# Patient Record
Sex: Male | Born: 1995 | Race: Black or African American | Hispanic: No | Marital: Single | Smoking: Never smoker
Health system: Southern US, Community
[De-identification: ages and names within clinical notes are randomized; demographics above are authoritative.]

## PROBLEM LIST (undated history)

## (undated) DIAGNOSIS — F909 Attention-deficit hyperactivity disorder, unspecified type: Secondary | ICD-10-CM

## (undated) HISTORY — DX: Attention-deficit hyperactivity disorder, unspecified type: F90.9

---
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
## 2018-02-14 ENCOUNTER — Emergency Department (HOSPITAL_COMMUNITY): Payer: Medicaid Other

## 2018-02-14 ENCOUNTER — Other Ambulatory Visit: Payer: Self-pay

## 2018-02-14 ENCOUNTER — Encounter (HOSPITAL_COMMUNITY): Payer: Self-pay | Admitting: *Deleted

## 2018-02-14 ENCOUNTER — Emergency Department (HOSPITAL_COMMUNITY)
Admission: EM | Admit: 2018-02-14 | Discharge: 2018-02-15 | Disposition: A | Discharge status: Home / Self Care | Payer: Medicaid Other | Attending: Emergency Medicine | Admitting: Emergency Medicine

## 2018-02-14 DIAGNOSIS — Z79899 Other long term (current) drug therapy: Secondary | ICD-10-CM | POA: Insufficient documentation

## 2018-02-14 DIAGNOSIS — R1011 Right upper quadrant pain: Secondary | ICD-10-CM

## 2018-02-14 LAB — CBC
HCT: 43.5 % (ref 39.0–52.0)
Hemoglobin: 14.2 g/dL (ref 13.0–17.0)
MCH: 28.1 pg (ref 26.0–34.0)
MCHC: 32.6 g/dL (ref 30.0–36.0)
MCV: 86 fL (ref 78.0–100.0)
PLATELETS: 202 10*3/uL (ref 150–400)
RBC: 5.06 MIL/uL (ref 4.22–5.81)
RDW: 14 % (ref 11.5–15.5)
WBC: 7.1 10*3/uL (ref 4.0–10.5)

## 2018-02-14 LAB — COMPREHENSIVE METABOLIC PANEL
ALBUMIN: 4.3 g/dL (ref 3.5–5.0)
ALT: 26 U/L (ref 0–44)
ANION GAP: 10 (ref 5–15)
AST: 27 U/L (ref 15–41)
Alkaline Phosphatase: 45 U/L (ref 38–126)
BILIRUBIN TOTAL: 1 mg/dL (ref 0.3–1.2)
BUN: 9 mg/dL (ref 6–20)
CALCIUM: 9.7 mg/dL (ref 8.9–10.3)
CO2: 28 mmol/L (ref 22–32)
CREATININE: 1.06 mg/dL (ref 0.61–1.24)
Chloride: 106 mmol/L (ref 98–111)
GFR calc Af Amer: >60 mL/min (ref >60)
Glucose, Bld: 92 mg/dL (ref 70–99)
Potassium: 3.6 mmol/L (ref 3.5–5.1)
Sodium: 144 mmol/L (ref 135–145)
TOTAL PROTEIN: 7.8 g/dL (ref 6.5–8.1)

## 2018-02-14 LAB — LIPASE, BLOOD: LIPASE: 23 U/L (ref 11–51)

## 2018-02-14 NOTE — ED Provider Notes (Signed)
[facility]-EMERGENCY DEPT Provider Note   CSN: [id number] Arrival date & time: 02/14/18  4098     History   Chief Complaint Chief Complaint  Patient presents with  . Abdominal Pain    HPI Numair Masden is a 22 y.o. male.  The history is provided by the patient and medical records.  Abdominal Pain   Associated symptoms include nausea.     22 year old male with no significant past medical history presenting to the ED for right upper abdominal pain.  States started this morning he was trying to eat breakfast.  He cannot recall what he was trying to eat this morning.  States he did eat some crackers and water prior to arrival which also hurt his stomach.  He has noticed pain eases if not trying to eat.  He denies any fevers or chills.  Had a little bit of nausea this morning but that seems to have resolved.  Does report diet heavy in greasy fast food.  He has been trying to change his diet over the past 2 weeks.  No prior abdominal surgeries.  No medications tried prior to arrival.  Patient was eating potato chips in the waiting room at time of triage.  History reviewed. No pertinent past medical history.  There are no active problems to display for this patient.   History reviewed. No pertinent surgical history.      Home Medications    Prior to Admission medications   Medication Sig Start Date End Date Taking? Authorizing Provider  acetaminophen (TYLENOL) 500 MG tablet Take 500 mg by mouth every 6 (six) hours as needed for moderate pain.   Yes [provider]  ADDERALL XR 10 MG 24 hr capsule Take 10 mg by mouth daily.  12/07/17  Yes [provider]    Family History No family history on file.  Social History Social History   Tobacco Use  . Smoking status: Never Smoker  . Smokeless tobacco: Never Used  Substance Use Topics  . Alcohol use: Yes    Comment: states 'i'm never gonna drink again' and that he rarely drinks  . Drug use:  No     Allergies   Patient has no known allergies.   Review of Systems Review of Systems  Gastrointestinal: Positive for abdominal pain and nausea.  All other systems reviewed and are negative.    Physical Exam Updated Vital Signs BP (!) 146/65 (BP Location: Left Arm)   Pulse 65   Temp 98.6 F (37 C) (Oral)   Resp 17   Ht 6' (1.829 m)   Wt 80.3 kg   SpO2 97%   BMI 24.01 kg/m   Physical Exam  Constitutional: He is oriented to person, place, and time. He appears well-developed and well-nourished.  HENT:  Head: Normocephalic and atraumatic.  Mouth/Throat: Oropharynx is clear and moist.  Eyes: Pupils are equal, round, and reactive to light. Conjunctivae and EOM are normal.  Neck: Normal range of motion.  Cardiovascular: Normal rate, regular rhythm and normal heart sounds.  Pulmonary/Chest: Effort normal and breath sounds normal.  Abdominal: Soft. Bowel sounds are normal. There is no tenderness. There is no rigidity and no guarding.  Abdomen soft, non-tender; negative murphy's sign  Musculoskeletal: Normal range of motion.  Neurological: He is alert and oriented to person, place, and time.  Skin: Skin is warm and dry.  Psychiatric: He has a normal mood and affect.  Nursing note and vitals reviewed.    ED Treatments /  Results  Labs (all labs ordered are listed, but only abnormal results are displayed) Labs Reviewed  URINALYSIS, ROUTINE W REFLEX MICROSCOPIC - Abnormal; Notable for the following components:      Result Value   APPearance HAZY (*)    All other components within normal limits  LIPASE, BLOOD  COMPREHENSIVE METABOLIC PANEL  CBC    EKG None  Radiology US Abdomen Limited Ruq  Result Date: 02/15/2018 CLINICAL DATA:  Right upper quadrant abdominal pain EXAM: ULTRASOUND ABDOMEN LIMITED RIGHT UPPER QUADRANT COMPARISON:  None. FINDINGS: Gallbladder: No gallstones, gallbladder wall thickening, or pericholecystic fluid. Negative sonographic Murphy's sign.  Common bile duct: Diameter: 4 mm Liver: No focal lesion identified. Within normal limits in parenchymal echogenicity. Portal vein is patent on color Doppler imaging with normal direction of blood flow towards the liver. IMPRESSION: Negative right upper quadrant ultrasound. Electronically Signed   By: Charline Bills M.D.   On: 02/15/2018 00:54    Procedures Procedures (including critical care time)  Medications Ordered in ED Medications - No data to display   Initial Impression / Assessment and Plan / ED Course  I have reviewed the triage vital signs and the nursing notes.  Pertinent labs & imaging results that were available during my care of the patient were reviewed by me and considered in my medical decision making (see chart for details).  22 year old male here with right upper abdominal pain that began this morning.  Worse with eating, however was noted to be eating potato chips at time of triage.  He is afebrile and nontoxic.  Abdomen is soft and benign, no focal tenderness.  Negative Murphy sign.  Labs are reassuring.  Ultrasound obtained and is negative as well.  Remains nontoxic in appearance, no emesis here.  Vitals are stable.  Do not feel he requires further work-up at this time.  We will have him follow-up closely with his primary care doctor.  Return here for any new or worsening symptoms.  Final Clinical Impressions(s) / ED Diagnoses   Final diagnoses:  RUQ pain    ED Discharge Orders    None       Garlon Hatchet, PA-C 02/15/18 0303    Marily Memos, MD 02/15/18 1439

## 2018-02-14 NOTE — ED Triage Notes (Signed)
Pt reports RUQ pain with nausea this am around 0930.  Reports pain is worse with food or drink.  He was eating chips when called for triage.  Pain is non-radiating.

## 2018-02-14 NOTE — ED Notes (Signed)
Requested patient to urinate. 

## 2018-02-15 LAB — URINALYSIS, ROUTINE W REFLEX MICROSCOPIC
Bilirubin Urine: NEGATIVE
Glucose, UA: NEGATIVE mg/dL
Hgb urine dipstick: NEGATIVE
Ketones, ur: NEGATIVE mg/dL
LEUKOCYTES UA: NEGATIVE
NITRITE: NEGATIVE
PH: 7 (ref 5.0–8.0)
Protein, ur: NEGATIVE mg/dL
Specific Gravity, Urine: 1.021 (ref 1.005–1.030)

## 2018-02-15 NOTE — Discharge Instructions (Signed)
Your labs and ultrasound today were normal. We recommend that you follow-up with your primary care doctor. Return to the ED for new or worsening symptoms.

## 2019-03-08 IMAGING — US US ABDOMEN LIMITED
1 series · 14 of 25 positions shown · non-contrast
Comparison: None.

CLINICAL DATA: Right upper quadrant abdominal pain

EXAM:
ULTRASOUND ABDOMEN LIMITED RIGHT UPPER QUADRANT

[Series 1: us abdomen limited · 14 of 84 slices shown]
[im 1/84]
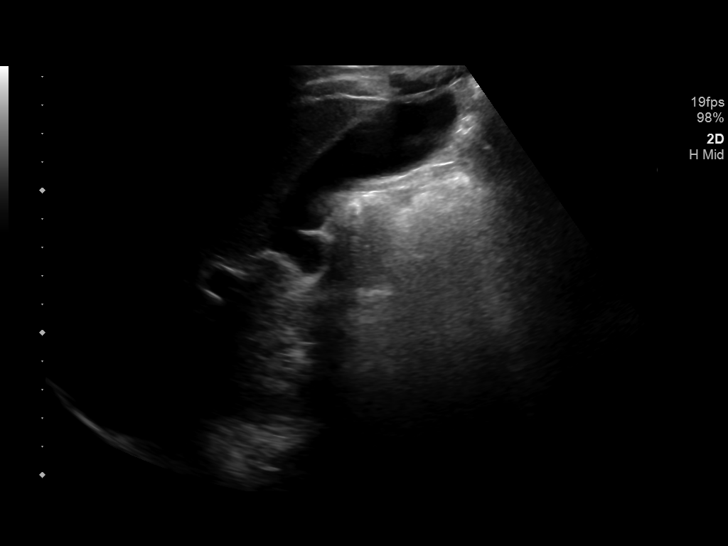
[im 7/84]
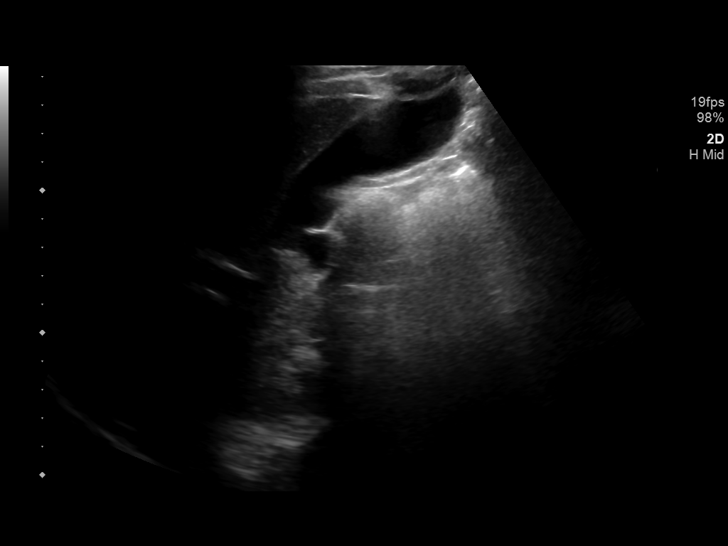
[im 14/84]
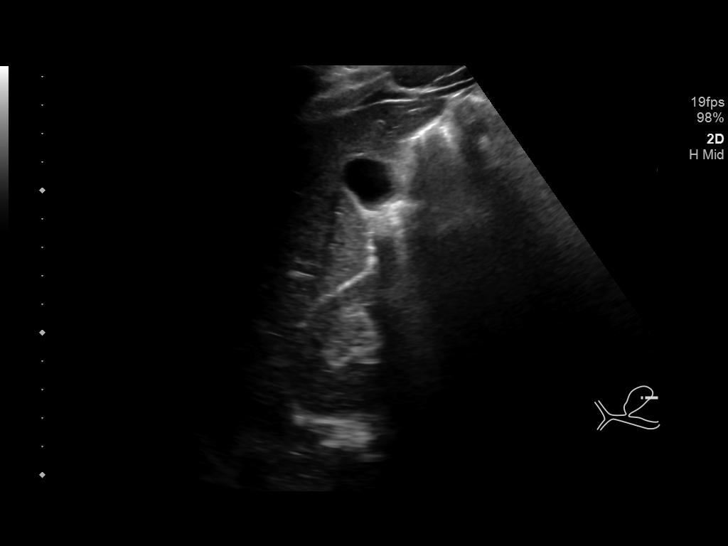
[im 21/84]
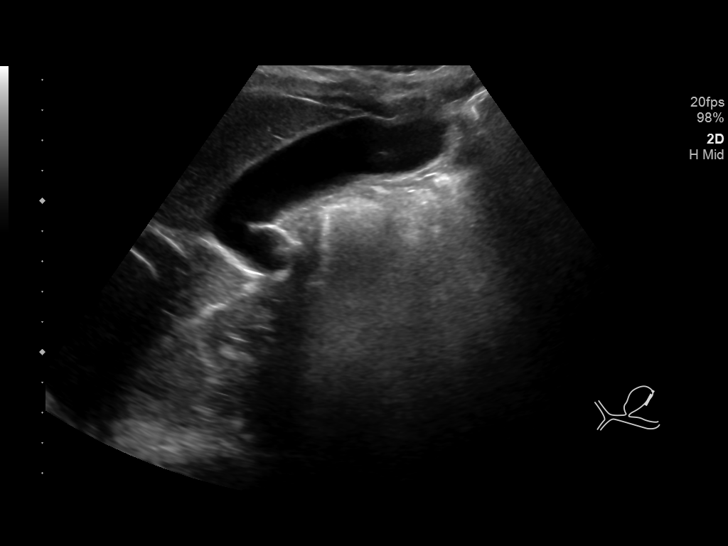
[im 28/84]
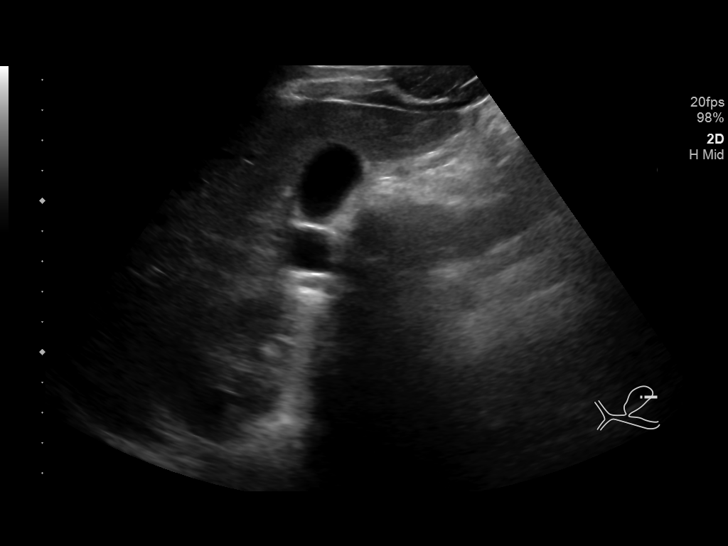
[im 32/84]
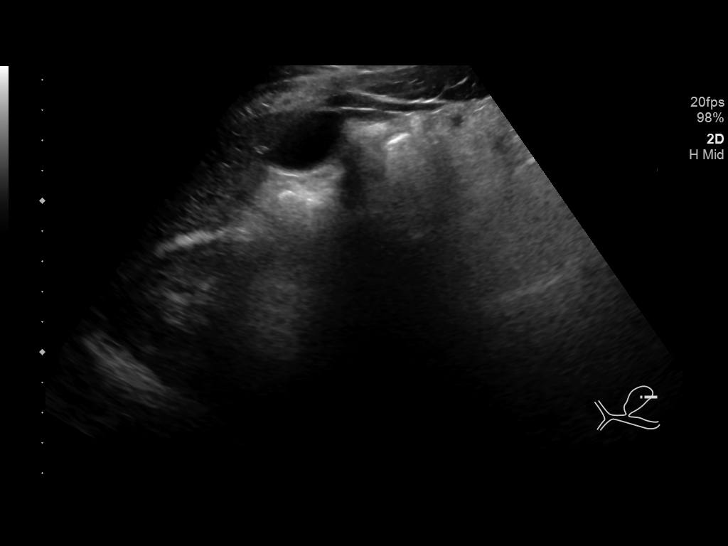
[im 39/84]
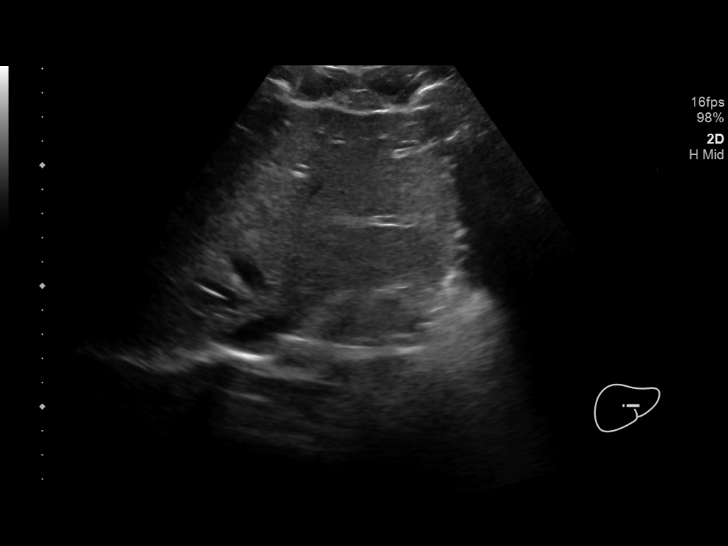
[im 45/84]
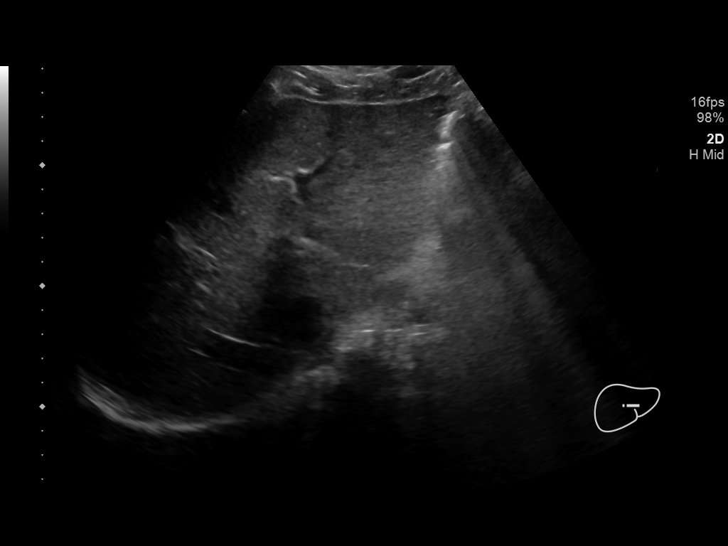
[im 52/84]
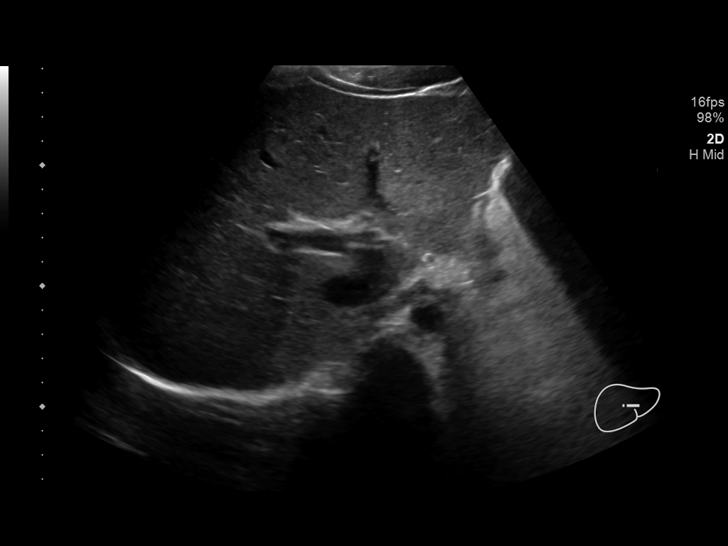
[im 56/84]
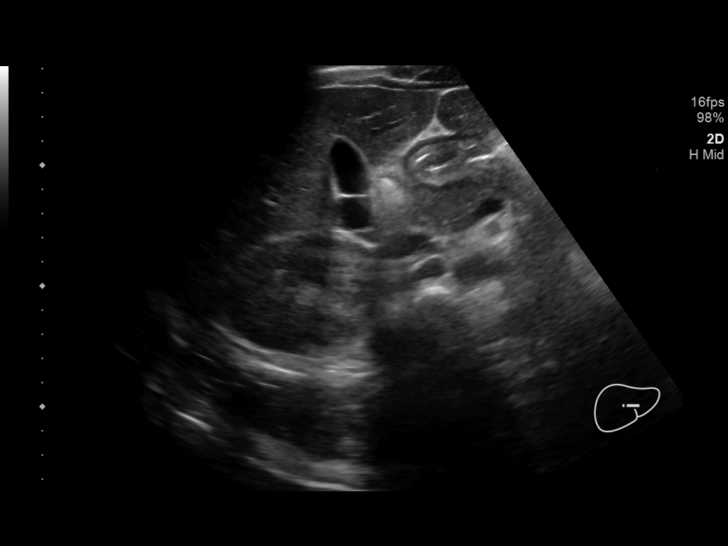
[im 63/84]
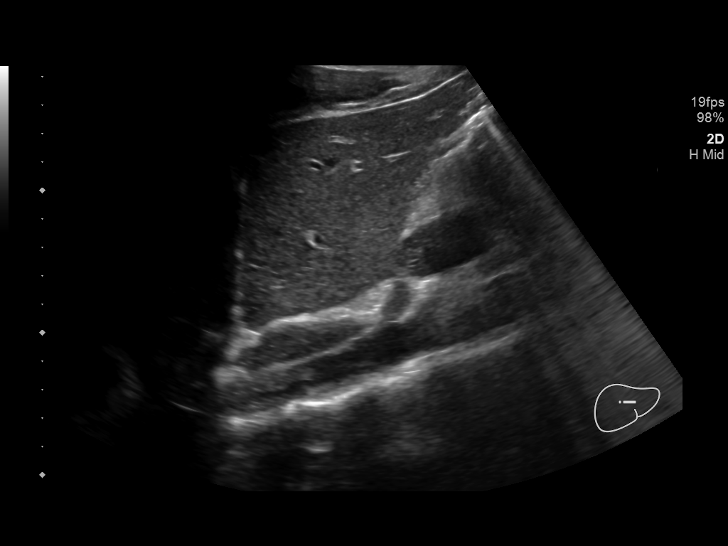
[im 70/84]
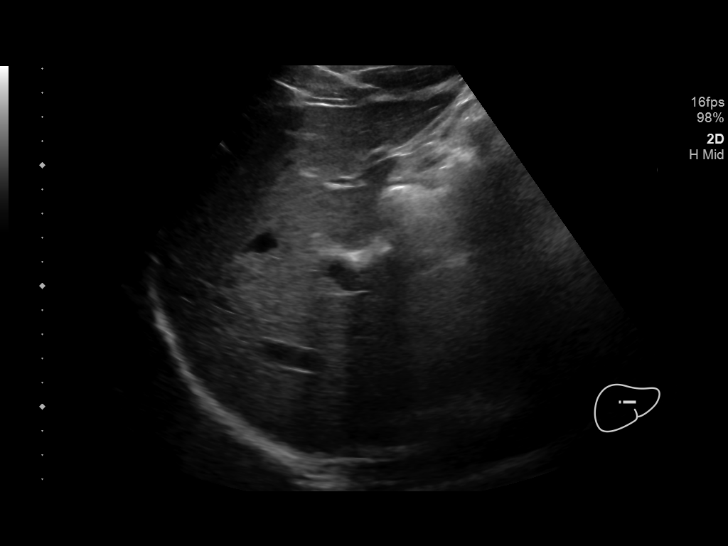
[im 77/84]
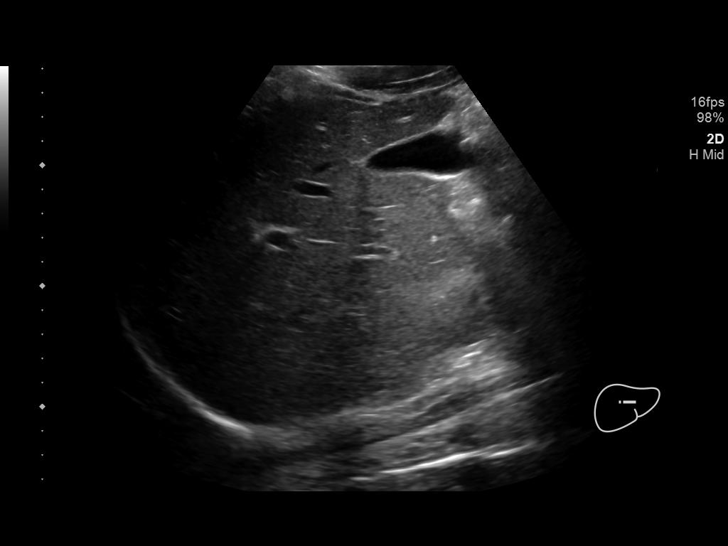
[im 84/84]
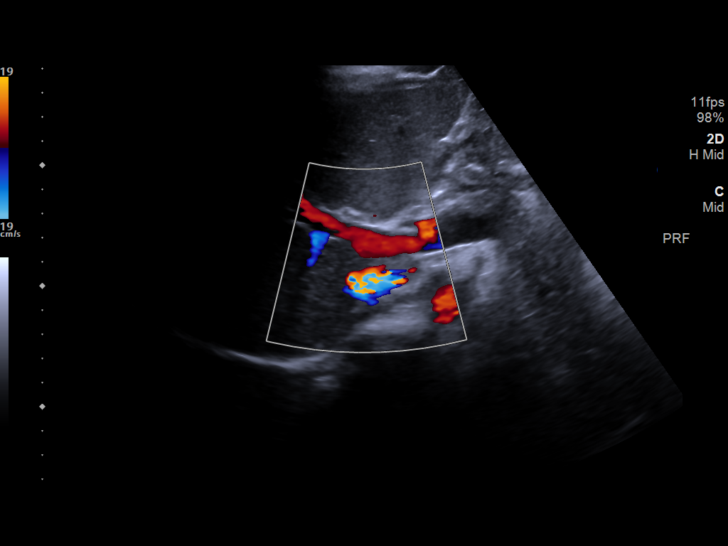

[14 of 25 positions shown; findings below may reference images not displayed]

FINDINGS: Gallbladder:

No gallstones, gallbladder wall thickening, or pericholecystic
fluid. Negative sonographic Murphy's sign.

Common bile duct:

Diameter: 4 mm

Liver:

No focal lesion identified. Within normal limits in parenchymal
echogenicity. Portal vein is patent on color Doppler imaging with
normal direction of blood flow towards the liver.
IMPRESSION: Negative right upper quadrant ultrasound.

## 2019-03-09 ENCOUNTER — Other Ambulatory Visit: Payer: Self-pay

## 2019-03-09 DIAGNOSIS — Z20822 Contact with and (suspected) exposure to covid-19: Secondary | ICD-10-CM

## 2019-03-11 LAB — NOVEL CORONAVIRUS, NAA: SARS-CoV-2, NAA: NOT DETECTED

## 2019-03-23 ENCOUNTER — Other Ambulatory Visit: Payer: Self-pay

## 2019-03-23 DIAGNOSIS — Z20822 Contact with and (suspected) exposure to covid-19: Secondary | ICD-10-CM

## 2019-03-28 LAB — NOVEL CORONAVIRUS, NAA: SARS-CoV-2, NAA: NOT DETECTED

## 2019-03-29 ENCOUNTER — Encounter (HOSPITAL_COMMUNITY): Payer: Self-pay | Admitting: *Deleted

## 2019-03-29 ENCOUNTER — Other Ambulatory Visit: Payer: Self-pay

## 2019-03-29 ENCOUNTER — Emergency Department (HOSPITAL_COMMUNITY)
Admission: EM | Admit: 2019-03-29 | Discharge: 2019-03-29 | Disposition: A | Discharge status: Home / Self Care | Payer: Medicaid Other | Attending: Pediatric Emergency Medicine | Admitting: Pediatric Emergency Medicine

## 2019-03-29 DIAGNOSIS — U071 COVID-19: Secondary | ICD-10-CM | POA: Diagnosis not present

## 2019-03-29 DIAGNOSIS — Z79899 Other long term (current) drug therapy: Secondary | ICD-10-CM | POA: Insufficient documentation

## 2019-03-29 DIAGNOSIS — Z20828 Contact with and (suspected) exposure to other viral communicable diseases: Secondary | ICD-10-CM

## 2019-03-29 DIAGNOSIS — Z20822 Contact with and (suspected) exposure to covid-19: Secondary | ICD-10-CM

## 2019-03-29 DIAGNOSIS — R519 Headache, unspecified: Secondary | ICD-10-CM | POA: Diagnosis present

## 2019-03-29 LAB — SARS CORONAVIRUS 2 (TAT 6-24 HRS): SARS Coronavirus 2: POSITIVE — AB

## 2019-03-29 NOTE — ED Triage Notes (Signed)
The pt had a negative  covid test last Thursday  He just received the results today  He thinks that the test was wrong and he has the covid  His roommate had one and it was positive  He has had a headache since the test and he has lost his smell and tatste

## 2019-03-30 NOTE — ED Provider Notes (Signed)
[facility] EMERGENCY DEPARTMENT Provider Note   CSN: [id number] Arrival date & time: 03/29/19  1411     History   Chief Complaint Chief Complaint  Patient presents with  . wants acovid test    HPI Curtis Kirk is a 23 y.o. male.     HPI  23yo M otherwise healthy who comes to [place] with intermittent headache, frontal, non-radiating, no photosensitivity.  He also notesloss of taste and smell.  Several close COVID contacts.  No fevers.  Overall body aches as well.  No vomiting or diarrhea.  Normal UO.  No dysuria.  Improved pain with tylenol at home.    History reviewed. No pertinent past medical history.  There are no active problems to display for this patient.   History reviewed. No pertinent surgical history.      Home Medications    Prior to Admission medications   Medication Sig Start Date End Date Taking? Authorizing Provider  acetaminophen (TYLENOL) 500 MG tablet Take 500 mg by mouth every 6 (six) hours as needed for moderate pain.    [provider]  ADDERALL XR 10 MG 24 hr capsule Take 10 mg by mouth daily.  12/07/17   [provider]    Family History No family history on file.  Social History Social History   Tobacco Use  . Smoking status: Never Smoker  . Smokeless tobacco: Never Used  Substance Use Topics  . Alcohol use: Yes    Comment: states 'i'm never gonna drink again' and that he rarely drinks  . Drug use: No     Allergies   Patient has no known allergies.   Review of Systems Review of Systems  Constitutional: Positive for activity change, appetite change and fatigue. Negative for chills and fever.  HENT: Negative for ear pain and sore throat.   Eyes: Negative for pain and visual disturbance.  Respiratory: Negative for cough and shortness of breath.   Cardiovascular: Negative for chest pain and palpitations.  Gastrointestinal: Negative for abdominal pain and vomiting.  Genitourinary: Negative for  dysuria and hematuria.  Musculoskeletal: Positive for arthralgias and myalgias. Negative for back pain.  Skin: Negative for color change and rash.  Neurological: Positive for headaches. Negative for seizures and syncope.  All other systems reviewed and are negative.    Physical Exam Updated Vital Signs BP (!) 113/98 (BP Location: Right Arm)   Pulse (!) 114   Temp 98.1 F (36.7 C) (Oral)   Resp 20   Ht 6\' 1"  (1.854 m)   Wt 80.7 kg   SpO2 98%   BMI 23.48 kg/m   Physical Exam Vitals signs and nursing note reviewed.  Constitutional:      General: He is not in acute distress.    Appearance: He is well-developed. He is not ill-appearing.  HENT:     Head: Normocephalic and atraumatic.     Nose: No congestion or rhinorrhea.     Mouth/Throat:     Mouth: Mucous membranes are moist.  Eyes:     Extraocular Movements: Extraocular movements intact.     Conjunctiva/sclera: Conjunctivae normal.     Pupils: Pupils are equal, round, and reactive to light.  Neck:     Musculoskeletal: Normal range of motion and neck supple. No neck rigidity.  Cardiovascular:     Rate and Rhythm: Normal rate and regular rhythm.     Heart sounds: No murmur.  Pulmonary:     Effort: Pulmonary effort is normal. No respiratory  distress.     Breath sounds: Normal breath sounds.  Abdominal:     Palpations: Abdomen is soft.     Tenderness: There is no abdominal tenderness.  Skin:    General: Skin is warm and dry.     Capillary Refill: Capillary refill takes less than 2 seconds.  Neurological:     General: No focal deficit present.     Mental Status: He is alert and oriented to person, place, and time.     Cranial Nerves: No cranial nerve deficit.     Sensory: Sensory deficit present.     Motor: No weakness.     Coordination: Coordination normal.     Gait: Gait normal.     Deep Tendon Reflexes: Reflexes normal.      ED Treatments / Results  Labs (all labs ordered are listed, but only abnormal  results are displayed) Labs Reviewed  SARS CORONAVIRUS 2 (TAT 6-24 HRS) - Abnormal; Notable for the following components:      Result Value   SARS Coronavirus 2 POSITIVE (*)    All other components within normal limits    EKG None  Radiology No results found.  Procedures Procedures (including critical care time)  Medications Ordered in ED Medications - No data to display   Initial Impression / Assessment and Plan / ED Course  I have reviewed the triage vital signs and the nursing notes.  Pertinent labs & imaging results that were available during my care of the patient were reviewed by me and considered in my medical decision making (see chart for details).       Curtis Kirk was evaluated in Emergency Department on 03/30/2019 for the symptoms described in the history of present illness. He was evaluated in the context of the global COVID-19 pandemic, which necessitated consideration that the patient might be at risk for infection with the SARS-CoV-2 virus that causes COVID-19. Institutional protocols and algorithms that pertain to the evaluation of patients at risk for COVID-19 are in a state of rapid change based on information released by regulatory bodies including the CDC and federal and state organizations. These policies and algorithms were followed during the patient's care in the ED.  Patient is overall well appearing with symptoms consistent with a viral illness.    Exam notable for hemodynamically appropriate and stable on room air without fever normal saturations.  No respiratory distress.  Normal cardiac exam benign abdomen.  Normal capillary refill.  Patient overall well-hydrated and well-appearing at time of my exam.  I have considered the following causes of headache: trauma Pneumonia, meningitis, bacteremia, and other serious bacterial illnesses.  Patient's presentation is not consistent with any of these causes of headache.    COVID pending (positive result) at  time of discharge.   Patient overall well-appearing and is appropriate for discharge at this time  Return precautions discussed with family prior to discharge and they were advised to follow with pcp as needed if symptoms worsen or fail to improve.  Final Clinical Impressions(s) / ED Diagnoses   Final diagnoses:  Close exposure to COVID-19 virus    ED Discharge Orders    None       Carlena Ruybal, Lillia Carmel, MD 03/30/19 1007

## 2019-08-24 ENCOUNTER — Ambulatory Visit: Payer: Medicaid Other | Attending: Internal Medicine

## 2019-08-24 DIAGNOSIS — Z23 Encounter for immunization: Secondary | ICD-10-CM

## 2019-08-24 NOTE — Progress Notes (Signed)
   Covid-19 Vaccination Clinic  Name:  Curtis Kirk    MRN: [id number] DOB: 1995-05-27  08/24/2019  [name] was observed post Covid-19 immunization for 15 minutes without incident. He was provided with Vaccine Information Sheet and instruction to access the V-Safe system.   [name] was instructed to call 911 with any severe reactions post vaccine: Marland Kitchen Difficulty breathing  . Swelling of face and throat  . A fast heartbeat  . A bad rash all over body  . Dizziness and weakness   Immunizations Administered    Name Date Dose VIS Date Route   Pfizer COVID-19 Vaccine 08/24/2019 12:08 PM 0.3 mL 04/28/2019 Intramuscular   Manufacturer: [employer], [employer]   Lot: [id number]   NDC: [address]-9

## 2019-08-26 ENCOUNTER — Encounter (HOSPITAL_COMMUNITY): Payer: Self-pay

## 2019-08-26 ENCOUNTER — Emergency Department (HOSPITAL_COMMUNITY)
Admission: EM | Admit: 2019-08-26 | Discharge: 2019-08-26 | Disposition: A | Discharge status: Home / Self Care | Payer: Medicaid Other | Attending: Emergency Medicine | Admitting: Emergency Medicine

## 2019-08-26 DIAGNOSIS — F10920 Alcohol use, unspecified with intoxication, uncomplicated: Secondary | ICD-10-CM | POA: Insufficient documentation

## 2019-08-26 DIAGNOSIS — F10929 Alcohol use, unspecified with intoxication, unspecified: Secondary | ICD-10-CM | POA: Diagnosis present

## 2019-08-26 LAB — CBC
HCT: 44.1 % (ref 39.0–52.0)
Hemoglobin: 14.2 g/dL (ref 13.0–17.0)
MCH: 28.7 pg (ref 26.0–34.0)
MCHC: 32.2 g/dL (ref 30.0–36.0)
MCV: 89.1 fL (ref 80.0–100.0)
Platelets: 181 10*3/uL (ref 150–400)
RBC: 4.95 MIL/uL (ref 4.22–5.81)
RDW: 13.8 % (ref 11.5–15.5)
WBC: 5.1 10*3/uL (ref 4.0–10.5)
nRBC: 0 % (ref 0.0–0.2)

## 2019-08-26 LAB — COMPREHENSIVE METABOLIC PANEL
ALT: 15 U/L (ref 0–44)
AST: 19 U/L (ref 15–41)
Albumin: 4.7 g/dL (ref 3.5–5.0)
Alkaline Phosphatase: 47 U/L (ref 38–126)
Anion gap: 12 (ref 5–15)
BUN: 10 mg/dL (ref 6–20)
CO2: 25 mmol/L (ref 22–32)
Calcium: 9.1 mg/dL (ref 8.9–10.3)
Chloride: 103 mmol/L (ref 98–111)
Creatinine, Ser: 0.98 mg/dL (ref 0.61–1.24)
GFR calc Af Amer: >60 mL/min (ref >60)
GFR calc non Af Amer: >60 mL/min (ref >60)
Glucose, Bld: 118 mg/dL — ABNORMAL HIGH (ref 70–99)
Potassium: 3.8 mmol/L (ref 3.5–5.1)
Sodium: 140 mmol/L (ref 135–145)
Total Bilirubin: 0.4 mg/dL (ref 0.3–1.2)
Total Protein: 8.2 g/dL — ABNORMAL HIGH (ref 6.5–8.1)

## 2019-08-26 LAB — ETHANOL: Alcohol, Ethyl (B): 272 mg/dL — ABNORMAL HIGH (ref <10)

## 2019-08-26 NOTE — ED Provider Notes (Signed)
  Physical Exam  BP (!) 104/56   Pulse 74   Temp 97.9 F (36.6 C) (Oral)   Resp 17   SpO2 95%   Physical Exam  ED Course/Procedures     Procedures  MDM   Patient care assumed from [place], [place] at shift change, please see his note for full HPI.  Briefly, patient here have only drinking on the alcohol intoxication.  Vomiting.  Plan is to metabolize to freedom.  7:36 AM patient evaluated by me, is able to respond in full sentences, provided with a sandwich along with liquids to help metabolize.   8:40 AM patient is ambulatory in the ED with a steady gait.  He is requesting his "white sure with Rose pedals ".  He arrived with a on Ensure per nursing staff.  He was provided with a mask along with food.  Vitals are within normal limits, he is ambulatory with a steady gait, patient stable for discharge.  Portions of this note were generated with Scientist, clinical (histocompatibility and immunogenetics). Dictation errors may occur despite best attempts at proofreading.         Claude Manges, PA-C 08/26/19 0840    Gilda Crease, MD 08/26/19 [phone]

## 2019-08-26 NOTE — ED Triage Notes (Signed)
Patient comes from home. Patient was drinking and was vomiting. Patient is cooperative.

## 2019-08-26 NOTE — ED Provider Notes (Signed)
[facility] DEPT Provider Note   CSN: [id number] Arrival date & time: 08/26/19  7510     History Chief Complaint  Patient presents with  . Alcohol Intoxication    Curtis Kirk is a 24 y.o. male.  Patient presents to the emergency department with a chief complaint of alcohol intoxication.  Per RN, patient was drinking heavily with his friends tonight.  They brought him to the emergency department from home because he was vomiting.  Level 5 caveat applies secondary to alcohol intoxication.  The history is provided by the patient and medical records. No language interpreter was used.       History reviewed. No pertinent past medical history.  There are no problems to display for this patient.   History reviewed. No pertinent surgical history.     No family history on file.  Social History   Tobacco Use  . Smoking status: Never Smoker  . Smokeless tobacco: Never Used  Substance Use Topics  . Alcohol use: Yes    Comment: states 'i'm never gonna drink again' and that he rarely drinks  . Drug use: No    Home Medications Prior to Admission medications   Medication Sig Start Date End Date Taking? Authorizing Provider  acetaminophen (TYLENOL) 500 MG tablet Take 500 mg by mouth every 6 (six) hours as needed for moderate pain.   Yes [provider]  ADDERALL XR 20 MG 24 hr capsule Take 20 mg by mouth at bedtime. 08/11/19  Yes [provider]    Allergies    Patient has no known allergies.  Review of Systems   Review of Systems  Unable to perform ROS: Mental status change    Physical Exam Updated Vital Signs BP 113/68   Pulse 82   Temp 97.9 F (36.6 C) (Oral)   Resp 18   SpO2 95%   Physical Exam Vitals and nursing note reviewed.  Constitutional:      Appearance: He is well-developed.     Comments: Asleep  HENT:     Head: Normocephalic and atraumatic.  Eyes:     Conjunctiva/sclera: Conjunctivae normal.    Cardiovascular:     Rate and Rhythm: Normal rate and regular rhythm.     Heart sounds: No murmur.  Pulmonary:     Effort: Pulmonary effort is normal. No respiratory distress.     Breath sounds: Normal breath sounds.  Abdominal:     Palpations: Abdomen is soft.     Tenderness: There is no abdominal tenderness.  Musculoskeletal:     Cervical back: Neck supple.  Skin:    General: Skin is warm and dry.  Neurological:     Comments: Awakens to voice  Psychiatric:     Comments: Intoxicated     ED Results / Procedures / Treatments   Labs (all labs ordered are listed, but only abnormal results are displayed) Labs Reviewed  COMPREHENSIVE METABOLIC PANEL - Abnormal; Notable for the following components:      Result Value   Glucose, Bld 118 (*)    Total Protein 8.2 (*)    All other components within normal limits  ETHANOL - Abnormal; Notable for the following components:   Alcohol, Ethyl (B) 272 (*)    All other components within normal limits  CBC  RAPID URINE DRUG SCREEN, HOSP PERFORMED    EKG None  Radiology No results found.  Procedures Procedures (including critical care time)  Medications Ordered in ED Medications - No data to display  ED Course  I have reviewed the triage vital signs and the nursing notes.  Pertinent labs & imaging results that were available during my care of the patient were reviewed by me and considered in my medical decision making (see chart for details).    MDM Rules/Calculators/A&P                      Patient here with ETOH intoxication.    Ethanol is 272.  Patient is still not sober enough for independent discharge.  He is still sobering up here.  No reported SI/HI.  He will need to be reassessed once more sober.  Patient signed out to [place], [place], who will continue care.  Final Clinical Impression(s) / ED Diagnoses Final diagnoses:  None    Rx / DC Orders ED Discharge Orders    None       Roxy Horseman, PA-C 08/26/19  5498    Gilda Crease, MD 08/26/19 [phone]

## 2019-09-18 ENCOUNTER — Ambulatory Visit: Payer: Self-pay

## 2019-09-20 ENCOUNTER — Ambulatory Visit: Payer: Medicaid Other | Attending: Internal Medicine

## 2019-09-20 DIAGNOSIS — Z23 Encounter for immunization: Secondary | ICD-10-CM

## 2019-09-20 NOTE — Progress Notes (Signed)
   Covid-19 Vaccination Clinic  Name:  Curtis Kirk    MRN: [id number] DOB: 09/30/1995  09/20/2019  Mr. Curtis Kirk was observed post Covid-19 immunization for 15 minutes without incident. He was provided with Vaccine Information Sheet and instruction to access the V-Safe system.   Mr. Curtis Kirk was instructed to call 911 with any severe reactions post vaccine: Marland Kitchen Difficulty breathing  . Swelling of face and throat  . A fast heartbeat  . A bad rash all over body  . Dizziness and weakness   Immunizations Administered    Name Date Dose VIS Date Route   Pfizer COVID-19 Vaccine 09/20/2019  1:19 PM 0.3 mL 07/12/2018 Intramuscular   Manufacturer: [employer], [employer]   Lot: [id number]   NDC: [address]-3

## 2020-04-29 ENCOUNTER — Ambulatory Visit (HOSPITAL_COMMUNITY)
Admission: EM | Admit: 2020-04-29 | Discharge: 2020-04-29 | Disposition: A | Discharge status: Home / Self Care | Payer: Medicaid Other | Attending: Emergency Medicine | Admitting: Emergency Medicine

## 2020-04-29 ENCOUNTER — Other Ambulatory Visit: Payer: Self-pay

## 2020-04-29 ENCOUNTER — Encounter (HOSPITAL_COMMUNITY): Payer: Self-pay

## 2020-04-29 DIAGNOSIS — S61412A Laceration without foreign body of left hand, initial encounter: Secondary | ICD-10-CM

## 2020-04-29 DIAGNOSIS — Z20822 Contact with and (suspected) exposure to covid-19: Secondary | ICD-10-CM | POA: Diagnosis present

## 2020-04-29 NOTE — ED Provider Notes (Signed)
[facility]    CSN: [id number] Arrival date & time: 04/29/20  1421      History   Chief Complaint Chief Complaint  Patient presents with  . Laceration    HPI Curtis Kirk is a 24 y.o. male.   Patient presents with a puncture wound on his left palm which occurred 2 to 3 days ago.  He states he cut it on glass.  He states he removed the piece of glass without difficulty.  He reports feeling decreased strength in his left hand since then.  He denies fever, chills, redness, swelling, pain, numbness, paresthesias, or other symptoms.  He states his last tetanus was 4 years ago.  He also states he needs a COVID test for work.  He states he left early today due to lightheadedness which resolved with rest.  He has history of anxiety and states he had a lot of work stress today.  The history is provided by the patient.    History reviewed. No pertinent past medical history.  There are no problems to display for this patient.   History reviewed. No pertinent surgical history.     Home Medications    Prior to Admission medications   Medication Sig Start Date End Date Taking? Authorizing Provider  acetaminophen (TYLENOL) 500 MG tablet Take 500 mg by mouth every 6 (six) hours as needed for moderate pain.    [provider]  ADDERALL XR 20 MG 24 hr capsule Take 20 mg by mouth at bedtime. 08/11/19   [provider]    Family History Family History  Problem Relation Age of Onset  . Healthy Mother   . Healthy Father     Social History Social History   Tobacco Use  . Smoking status: Never Smoker  . Smokeless tobacco: Never Used  Substance Use Topics  . Alcohol use: Yes    Comment: states 'i'm never gonna drink again' and that he rarely drinks  . Drug use: No     Allergies   Patient has no known allergies.   Review of Systems Review of Systems  Constitutional: Negative for chills and fever.  HENT: Negative for ear pain and sore throat.    Eyes: Negative for pain and visual disturbance.  Respiratory: Negative for cough and shortness of breath.   Cardiovascular: Negative for chest pain and palpitations.  Gastrointestinal: Negative for abdominal pain and vomiting.  Genitourinary: Negative for dysuria and hematuria.  Musculoskeletal: Negative for arthralgias and back pain.  Skin: Positive for wound. Negative for color change.  Neurological: Positive for weakness. Negative for seizures, syncope and numbness.  All other systems reviewed and are negative.    Physical Exam Triage Vital Signs ED Triage Vitals  Enc Vitals Group     BP      Pulse      Resp      Temp      Temp src      SpO2      Weight      Height      Head Circumference      Peak Flow      Pain Score      Pain Loc      Pain Edu?      Excl. in GC?    No data found.  Updated Vital Signs BP 127/80 (BP Location: Right Arm)   Pulse 75   Temp 97.8 F (36.6 C) (Oral)   Resp 18   SpO2 99%  Visual Acuity Right Eye Distance:   Left Eye Distance:   Bilateral Distance:    Right Eye Near:   Left Eye Near:    Bilateral Near:     Physical Exam Vitals and nursing note reviewed.  Constitutional:      General: He is not in acute distress.    Appearance: He is well-developed and well-nourished. He is not ill-appearing.  HENT:     Head: Normocephalic and atraumatic.     Mouth/Throat:     Mouth: Mucous membranes are moist.  Eyes:     Conjunctiva/sclera: Conjunctivae normal.  Cardiovascular:     Rate and Rhythm: Normal rate and regular rhythm.     Heart sounds: Normal heart sounds.  Pulmonary:     Effort: Pulmonary effort is normal. No respiratory distress.     Breath sounds: Normal breath sounds.  Abdominal:     Palpations: Abdomen is soft.     Tenderness: There is no abdominal tenderness.  Musculoskeletal:        General: No swelling, tenderness, deformity, signs of injury or edema. Normal range of motion.     Cervical back: Neck supple.   Skin:    General: Skin is warm and dry.     Capillary Refill: Capillary refill takes less than 2 seconds.     Findings: Lesion present. No bruising or erythema.     Comments: Small puncture wound on left palm; no drainage or erythema.   Neurological:     General: No focal deficit present.     Mental Status: He is alert and oriented to person, place, and time.     Sensory: No sensory deficit.     Motor: No weakness.     Coordination: Coordination normal.     Gait: Gait normal.  Psychiatric:        Mood and Affect: Mood and affect and mood normal.        Behavior: Behavior normal.        UC Treatments / Results  Labs (all labs ordered are listed, but only abnormal results are displayed) Labs Reviewed  SARS CORONAVIRUS 2 (TAT 6-24 HRS)    EKG   Radiology No results found.  Procedures Procedures (including critical care time)  Medications Ordered in UC Medications - No data to display  Initial Impression / Assessment and Plan / UC Course  I have reviewed the triage vital signs and the nursing notes.  Pertinent labs & imaging results that were available during my care of the patient were reviewed by me and considered in my medical decision making (see chart for details).   Laceration of left hand.  Patient request for COVID test.  Patient reports he is up-to-date on his tetanus.  No evidence of infection.  Instructed patient to follow-up with orthopedic hand specialist for his perceived decreased motor function in his left hand.  PCR COVID pending.  Instructed patient to self quarantine until the test result is back.  He agrees to plan of care.   Final Clinical Impressions(s) / UC Diagnoses   Final diagnoses:  Laceration of left hand without foreign body, initial encounter  Encounter for laboratory testing for COVID-19 virus     Discharge Instructions     Follow-up with the orthopedic hand specialist listed below.    Your COVID test is pending.  You should  self quarantine until the test result is back.        ED Prescriptions    None  PDMP not reviewed this encounter.   Mickie Bail, NP 04/29/20 1655

## 2020-04-29 NOTE — Discharge Instructions (Signed)
Follow-up with the orthopedic hand specialist listed below.    Your COVID test is pending.  You should self quarantine until the test result is back.

## 2020-04-29 NOTE — ED Triage Notes (Signed)
Pt in with c/o left hand laceration that occurred a few days ago while he was lifting something onto a truck. states the shard of glass cut him and his motor function in left hand is slower today than normal  Denies any redness, swelling  Also requesting covid test for work

## 2020-04-30 LAB — SARS CORONAVIRUS 2 (TAT 6-24 HRS): SARS Coronavirus 2: NEGATIVE

## 2021-02-07 ENCOUNTER — Other Ambulatory Visit: Payer: Self-pay

## 2021-02-07 ENCOUNTER — Ambulatory Visit (HOSPITAL_COMMUNITY)
Admission: EM | Admit: 2021-02-07 | Discharge: 2021-02-07 | Disposition: A | Discharge status: Home / Self Care | Payer: Medicaid Other | Attending: Student | Admitting: Student

## 2021-02-07 DIAGNOSIS — Z1152 Encounter for screening for COVID-19: Secondary | ICD-10-CM | POA: Diagnosis present

## 2021-02-07 DIAGNOSIS — Z20822 Contact with and (suspected) exposure to covid-19: Secondary | ICD-10-CM

## 2021-02-07 NOTE — ED Triage Notes (Signed)
Pt is present today for a covid test. Pt denies any sx

## 2021-02-08 LAB — SARS CORONAVIRUS 2 (TAT 6-24 HRS): SARS Coronavirus 2: NEGATIVE

## 2021-02-08 NOTE — ED Provider Notes (Signed)
Covid testing only, did not see provider.   Rhys Martini, PA-C 02/08/21 1000

## 2021-03-12 ENCOUNTER — Encounter (HOSPITAL_COMMUNITY): Payer: Self-pay

## 2021-03-12 ENCOUNTER — Ambulatory Visit (HOSPITAL_COMMUNITY)
Admission: EM | Admit: 2021-03-12 | Discharge: 2021-03-12 | Disposition: A | Discharge status: Home / Self Care | Payer: Medicaid Other | Attending: Family Medicine | Admitting: Family Medicine

## 2021-03-12 ENCOUNTER — Other Ambulatory Visit: Payer: Self-pay

## 2021-03-12 DIAGNOSIS — Z113 Encounter for screening for infections with a predominantly sexual mode of transmission: Secondary | ICD-10-CM | POA: Insufficient documentation

## 2021-03-12 LAB — HIV ANTIBODY (ROUTINE TESTING W REFLEX): HIV Screen 4th Generation wRfx: NONREACTIVE

## 2021-03-12 NOTE — ED Triage Notes (Signed)
Pt presents for STD testing. Denies sxs and exposure.

## 2021-03-12 NOTE — ED Provider Notes (Signed)
[facility]    CSN: [id number] Arrival date & time: 03/12/21  1131      History   Chief Complaint Chief Complaint  Patient presents with   SEXUALLY TRANSMITTED DISEASE    HPI Ardon Franklin is a 25 y.o. male.   Patient presenting today with request of screening for STDs.  He states he has no known exposures and is completely asymptomatic today but recent became polyamorous so wanting screening.  Denies any known history of STDs in the past.   History reviewed. No pertinent past medical history.  There are no problems to display for this patient.   History reviewed. No pertinent surgical history.     Home Medications    Prior to Admission medications   Medication Sig Start Date End Date Taking? Authorizing Provider  acetaminophen (TYLENOL) 500 MG tablet Take 500 mg by mouth every 6 (six) hours as needed for moderate pain.    [provider]  ADDERALL XR 20 MG 24 hr capsule Take 20 mg by mouth at bedtime. 08/11/19   [provider]    Family History Family History  Problem Relation Age of Onset   Healthy Mother    Healthy Father     Social History Social History   Tobacco Use   Smoking status: Never   Smokeless tobacco: Never  Substance Use Topics   Alcohol use: Yes    Comment: states 'i'm never gonna drink again' and that he rarely drinks   Drug use: No     Allergies   Patient has no known allergies.   Review of Systems Review of Systems Per HPI  Physical Exam Triage Vital Signs ED Triage Vitals  Enc Vitals Group     BP 03/12/21 1343 (!) 141/61     Pulse Rate 03/12/21 1343 63     Resp 03/12/21 1343 19     Temp 03/12/21 1343 97.9 F (36.6 C)     Temp Source 03/12/21 1343 Oral     SpO2 03/12/21 1343 100 %     Weight --      Height --      Head Circumference --      Peak Flow --      Pain Score 03/12/21 1342 0     Pain Loc --      Pain Edu? --      Excl. in GC? --    No data found.  Updated Vital Signs BP  (!) 141/61 (BP Location: Left Arm)   Pulse 63   Temp 97.9 F (36.6 C) (Oral)   Resp 19   SpO2 100%   Visual Acuity Right Eye Distance:   Left Eye Distance:   Bilateral Distance:    Right Eye Near:   Left Eye Near:    Bilateral Near:     Physical Exam Vitals and nursing note reviewed.  Constitutional:      Appearance: Normal appearance.  HENT:     Head: Atraumatic.     Mouth/Throat:     Mouth: Mucous membranes are moist.  Eyes:     Extraocular Movements: Extraocular movements intact.     Conjunctiva/sclera: Conjunctivae normal.  Cardiovascular:     Rate and Rhythm: Normal rate and regular rhythm.  Pulmonary:     Effort: Pulmonary effort is normal.     Breath sounds: Normal breath sounds.  Abdominal:     General: Bowel sounds are normal. There is no distension.     Palpations: Abdomen is soft.  Tenderness: There is no abdominal tenderness. There is no guarding.  Genitourinary:    Comments: GU exam deferred, self swab performed Musculoskeletal:        General: Normal range of motion.     Cervical back: Normal range of motion and neck supple.  Skin:    General: Skin is warm and dry.  Neurological:     General: No focal deficit present.     Mental Status: He is oriented to person, place, and time.  Psychiatric:        Mood and Affect: Mood normal.        Thought Content: Thought content normal.        Judgment: Judgment normal.     UC Treatments / Results  Labs (all labs ordered are listed, but only abnormal results are displayed) Labs Reviewed  HIV ANTIBODY (ROUTINE TESTING W REFLEX)  RPR  CYTOLOGY, (ORAL, ANAL, URETHRAL) ANCILLARY ONLY    EKG   Radiology No results found.  Procedures Procedures (including critical care time)  Medications Ordered in UC Medications - No data to display  Initial Impression / Assessment and Plan / UC Course  I have reviewed the triage vital signs and the nursing notes.  Pertinent labs & imaging results that  were available during my care of the patient were reviewed by me and considered in my medical decision making (see chart for details).     Cytology swab and HIV and syphilis labs pending, discussed safe sexual practices and abstinence until results return.  Treat as needed based on results.  Final Clinical Impressions(s) / UC Diagnoses   Final diagnoses:  Routine screening for STI (sexually transmitted infection)   Discharge Instructions   None    ED Prescriptions   None    PDMP not reviewed this encounter.   Particia Nearing, [place] 03/12/21 1421

## 2021-03-13 LAB — CYTOLOGY, (ORAL, ANAL, URETHRAL) ANCILLARY ONLY
Chlamydia: NEGATIVE
Comment: NEGATIVE
Comment: NEGATIVE
Comment: NORMAL
Neisseria Gonorrhea: NEGATIVE
Trichomonas: NEGATIVE

## 2021-03-13 LAB — RPR: RPR Ser Ql: NONREACTIVE

## 2022-05-14 ENCOUNTER — Encounter: Payer: Self-pay | Admitting: Internal Medicine

## 2022-05-14 ENCOUNTER — Ambulatory Visit (INDEPENDENT_AMBULATORY_CARE_PROVIDER_SITE_OTHER): Payer: Medicaid Other | Admitting: Internal Medicine

## 2022-05-14 VITALS — BP 120/78 | HR 75 | Temp 98.5°F | Ht 73.0 in | Wt 194.0 lb

## 2022-05-14 DIAGNOSIS — R5383 Other fatigue: Secondary | ICD-10-CM | POA: Diagnosis not present

## 2022-05-14 DIAGNOSIS — F900 Attention-deficit hyperactivity disorder, predominantly inattentive type: Secondary | ICD-10-CM

## 2022-05-14 LAB — BASIC METABOLIC PANEL
BUN: 13 mg/dL (ref 6–23)
CO2: 31 mEq/L (ref 19–32)
Calcium: 9.8 mg/dL (ref 8.4–10.5)
Chloride: 103 mEq/L (ref 96–112)
Creatinine, Ser: 1.04 mg/dL (ref 0.40–1.50)
GFR: 100.39 mL/min (ref >60.00)
Glucose, Bld: 84 mg/dL (ref 70–99)
Potassium: 3.9 mEq/L (ref 3.5–5.1)
Sodium: 139 mEq/L (ref 135–145)

## 2022-05-14 LAB — CBC WITH DIFFERENTIAL/PLATELET
Basophils Absolute: 0 10*3/uL (ref 0.0–0.1)
Basophils Relative: 0.5 % (ref 0.0–3.0)
Eosinophils Absolute: 0.1 10*3/uL (ref 0.0–0.7)
Eosinophils Relative: 2 % (ref 0.0–5.0)
HCT: 41.7 % (ref 39.0–52.0)
Hemoglobin: 13.8 g/dL (ref 13.0–17.0)
Lymphocytes Relative: 30.2 % (ref 12.0–46.0)
Lymphs Abs: 1.8 10*3/uL (ref 0.7–4.0)
MCHC: 33 g/dL (ref 30.0–36.0)
MCV: 84.5 fl (ref 78.0–100.0)
Monocytes Absolute: 0.5 10*3/uL (ref 0.1–1.0)
Monocytes Relative: 8.5 % (ref 3.0–12.0)
Neutro Abs: 3.4 10*3/uL (ref 1.4–7.7)
Neutrophils Relative %: 58.8 % (ref 43.0–77.0)
Platelets: 218 10*3/uL (ref 150.0–400.0)
RBC: 4.93 Mil/uL (ref 4.22–5.81)
RDW: 14.1 % (ref 11.5–15.5)
WBC: 5.8 10*3/uL (ref 4.0–10.5)

## 2022-05-14 LAB — HEPATIC FUNCTION PANEL
ALT: 17 U/L (ref 0–53)
AST: 23 U/L (ref 0–37)
Albumin: 4.6 g/dL (ref 3.5–5.2)
Alkaline Phosphatase: 51 U/L (ref 39–117)
Bilirubin, Direct: 0.1 mg/dL (ref 0.0–0.3)
Total Bilirubin: 0.3 mg/dL (ref 0.2–1.2)
Total Protein: 7.9 g/dL (ref 6.0–8.3)

## 2022-05-14 NOTE — Progress Notes (Signed)
Subjective:  Patient ID: Curtis Kirk, male    DOB: 1996/03/09  Age: 26 y.o. MRN: [id number]  CC: ADHD   HPI [name] presents for establishing -  He has a history of ADHD and previously took Adderall and did well with it.  He feels scattered, has a lack of focus, has difficulty completing tasks and all of this makes him feel anxious.  He also complains of chronic fatigue.  History Curtis Kirk has a past medical history of ADHD.   He has no past surgical history on file.   His family history includes Lupus in his mother.He reports that he has never smoked. He has never used smokeless tobacco. He reports current alcohol use of about 3.0 standard drinks of alcohol per week. He reports that he does not use drugs.  Outpatient Medications Prior to Visit  Medication Sig Dispense Refill   acetaminophen (TYLENOL) 500 MG tablet Take 500 mg by mouth every 6 (six) hours as needed for moderate pain.     ADDERALL XR 20 MG 24 hr capsule Take 20 mg by mouth at bedtime.     No facility-administered medications prior to visit.    ROS Review of Systems  Constitutional:  Positive for fatigue. Negative for unexpected weight change.  HENT: Negative.    Respiratory:  Negative for cough, chest tightness, shortness of breath and wheezing.   Cardiovascular:  Negative for chest pain, palpitations and leg swelling.  Gastrointestinal:  Negative for abdominal pain, diarrhea, nausea and vomiting.  Genitourinary: Negative.  Negative for difficulty urinating, dysuria, penile swelling, scrotal swelling and testicular pain.  Musculoskeletal: Negative.  Negative for arthralgias.  Skin: Negative.   Neurological: Negative.  Negative for dizziness and weakness.  Hematological:  Negative for adenopathy. Does not bruise/bleed easily.  Psychiatric/Behavioral:  Positive for decreased concentration and sleep disturbance. Negative for agitation, behavioral problems, confusion, dysphoric mood, self-injury and suicidal  ideas. The patient is nervous/anxious. The patient is not hyperactive.     Objective:  BP 120/78 (BP Location: Left Arm, Patient Position: Sitting, Cuff Size: Large)   Pulse 75   Temp 98.5 F (36.9 C) (Oral)   Ht 6\' 1"  (1.854 m)   Wt 194 lb (88 kg)   SpO2 97%   BMI 25.60 kg/m   Physical Exam Vitals reviewed.  HENT:     Nose: Nose normal.     Mouth/Throat:     Mouth: Mucous membranes are moist.  Eyes:     General: No scleral icterus.    Conjunctiva/sclera: Conjunctivae normal.  Cardiovascular:     Rate and Rhythm: Normal rate and regular rhythm.     Heart sounds: No murmur heard. Pulmonary:     Effort: Pulmonary effort is normal.     Breath sounds: No stridor. No wheezing, rhonchi or rales.  Abdominal:     General: Abdomen is flat.     Palpations: There is no mass.     Tenderness: There is no abdominal tenderness. There is no guarding.     Hernia: No hernia is present.  Musculoskeletal:        General: Normal range of motion.     Cervical back: Neck supple.     Right lower leg: No edema.     Left lower leg: No edema.  Lymphadenopathy:     Cervical: No cervical adenopathy.  Skin:    General: Skin is warm and dry.  Neurological:     General: No focal deficit present.     Mental Status:  He is alert.  Psychiatric:        Mood and Affect: Mood normal.        Behavior: Behavior normal.     Lab Results  Component Value Date   WBC 5.8 05/14/2022   HGB 13.8 05/14/2022   HCT 41.7 05/14/2022   PLT 218.0 05/14/2022   GLUCOSE 84 05/14/2022   ALT 17 05/14/2022   AST 23 05/14/2022   NA 139 05/14/2022   K 3.9 05/14/2022   CL 103 05/14/2022   CREATININE 1.04 05/14/2022   BUN 13 05/14/2022   CO2 31 05/14/2022   TSH 1.93 05/14/2022     Assessment & Plan:   [name] was seen today for adhd.  Diagnoses and all orders for this visit:  Other fatigue- His labs are negative for secondary causes.  Will treat the ADHD. -     TSH; Future -     Hepatic function panel;  Future -     CBC with Differential/Platelet; Future -     Basic metabolic panel; Future -     Basic metabolic panel -     CBC with Differential/Platelet -     Hepatic function panel -     TSH  Attention deficit hyperactivity disorder (ADHD), predominantly inattentive type -     TSH; Future -     Hepatic function panel; Future -     CBC with Differential/Platelet; Future -     Basic metabolic panel; Future -     Basic metabolic panel -     CBC with Differential/Platelet -     Hepatic function panel -     TSH -     ADDERALL XR 20 MG 24 hr capsule; Take 1 capsule (20 mg total) by mouth daily.   I have changed Rayna Sexton Wichert's Adderall XR. I am also having him maintain his acetaminophen.  Meds ordered this encounter  Medications   ADDERALL XR 20 MG 24 hr capsule    Sig: Take 1 capsule (20 mg total) by mouth daily.    Dispense:  30 capsule    Refill:  0     Follow-up: Return in about 3 months (around 08/13/2022).  Sanda Linger, MD

## 2022-05-14 NOTE — Patient Instructions (Signed)
Attention Deficit Hyperactivity Disorder, Adult Attention deficit hyperactivity disorder (ADHD) is a mental health disorder that starts during childhood. For many people with ADHD, the disorder continues into the adult years. Treatment can help you manage your symptoms. There are three main types of ADHD: Inattentive. With this type, adults have difficulty paying attention. This may affect cognitive abilities. Hyperactive-impulsive. With this type, adults have a lot of energy and have difficulty controlling their behavior. Combination type. Some people may have symptoms of both types. What are the causes? The exact cause of ADHD is not known. Most experts believe a person's genes and environment possibly contribute to ADHD. What increases the risk? The following factors may make you more likely to develop this condition: Having a first-degree relative such as a parent, brother, or sister, with the condition. Being born before 37 weeks of pregnancy (prematurely) or at a low birth weight. Being born to a mother who smoked tobacco or drank alcohol during pregnancy. Having experienced a brain injury. Being exposed to lead or other toxins in the womb or early in life. What are the signs or symptoms? Symptoms of this condition depend on the type of ADHD. Symptoms of the inattentive type include: Difficulty paying attention or following instructions. Often making simple mistakes. Being disorganized. Avoiding tasks that require time and attention. Losing and forgetting things. Symptoms of the hyperactive-impulsive type include: Restlessness. Talking out of turn, interrupting others, or talking too much. Difficulty with: Sitting still. Feeling motivated. Relaxing. Waiting in line or waiting for a turn. People with the combination type have symptoms of both of the other types. In adults, this condition may lead to certain problems, such as: Keeping jobs. Performing tasks at work. Having  stable relationships. Being on time or keeping to a schedule. How is this diagnosed? This condition is diagnosed based on your current symptoms and your history of symptoms. The diagnosis can be made by a health care provider such as a primary care provider or a mental health care specialist. Your health care provider may use a symptom checklist or a behavior rating scale to evaluate your symptoms. Your health care provider may also want to talk with people who have observed your behaviors throughout your life. How is this treated? This condition can be treated with medicines and behavior therapy. Medicines may be the best option to reduce impulsive behaviors and improve attention. Your health care provider may recommend: Stimulant medicines. These are the most common medicines used for adult ADHD. They affect certain chemicals in the brain (neurotransmitters) and improve your ability to control your symptoms. A non-stimulant medicine. These medicines can also improve focus, attention, and impulsive behavior. It may take weeks to months to see the effects of this medicine. Counseling and behavioral management are also important for treating ADHD. Counseling is often used along with medicine. Your health care provider may suggest: Cognitive behavioral therapy (CBT). This type of therapy teaches you to replace negative thoughts and actions with positive thoughts and actions. When used as part of ADHD treatment, this therapy may also include: Coping strategies for organization, time management, impulse control, and stress reduction. Mindfulness and meditation training. Behavioral management. You may work with a coach who is specially trained to help people with ADHD manage and organize activities and function more effectively. Follow these instructions at home: Medicines  Take over-the-counter and prescription medicines only as told by your health care provider. Talk with your health care provider  about the possible side effects of your medicines and   how to manage them. Alcohol use Do not drink alcohol if: Your health care provider tells you not to drink. You are pregnant, may be pregnant, or are planning to become pregnant. If you drink alcohol: Limit how much you use to: 0-1 drink a day for women. 0-2 drinks a day for men. Know how much alcohol is in your drink. In the [place] one drink equals one 12 oz bottle of beer (355 mL), one 5 oz glass of wine (148 mL), or one 1 oz glass of hard liquor (44 mL). Lifestyle  Do not use illegal drugs. Get enough sleep. Eat a healthy diet. Exercise regularly. Exercise can help to reduce stress and anxiety. General instructions Learn as much as you can about adult ADHD, and work closely with your health care providers to find the treatments that work best for you. Follow the same schedule each day. Use reminder devices like notes, calendars, and phone apps to stay on time and organized. Keep all follow-up visits. Your health care provider will need to monitor your condition and adjust your treatment over time. Where to find more information A health care provider may be able to recommend resources that are available online or over the phone. You could start with: Attention Deficit Disorder Association (ADDA): add.org National Institute of Mental Health (NIMH): nimh.nih.gov Contact a health care provider if: Your symptoms continue to cause problems. You have side effects from your medicine, such as: Repeated muscle twitches, coughing, or speech outbursts. Sleep problems. Loss of appetite. Dizziness. Unusually fast heartbeat. Stomach pains. Headaches. You are struggling with anxiety, depression, or substance abuse. Get help right away if: You have a severe reaction to a medicine. This symptom may be an emergency. Get help right away. Call 911. Do not wait to see if the symptom will go away. Do not drive yourself to the hospital. Take  one of these steps if you feel like you may hurt yourself or others, or have thoughts about taking your own life: Go to your nearest emergency room. Call 911. Call the National Suicide Prevention Lifeline at 1-800-273-8255 or 988. This is open 24 hours a day Text the Crisis Text Line at 741741. Summary ADHD is a mental health disorder that starts during childhood and often continues into your adult years. The exact cause of ADHD is not known. Most experts believe genetics and environmental factors contribute to ADHD. There is no cure for ADHD, but treatment with medicine, cognitive behavioral therapy, or behavioral management can help you manage your condition. This information is not intended to replace advice given to you by your health care provider. Make sure you discuss any questions you have with your health care provider. Document Revised: 08/22/2021 Document Reviewed: 08/22/2021 Elsevier Patient Education  2023 Elsevier Inc.  

## 2022-05-15 LAB — TSH: TSH: 1.93 u[IU]/mL (ref 0.35–5.50)

## 2022-05-15 MED ORDER — ADDERALL XR 20 MG PO CP24: 20.0000 mg | ORAL_CAPSULE | Freq: Every day | ORAL | 0 refills | Status: DC

## 2022-05-17 ENCOUNTER — Encounter: Payer: Self-pay | Admitting: Internal Medicine

## 2022-06-18 ENCOUNTER — Other Ambulatory Visit: Payer: Self-pay | Admitting: Internal Medicine

## 2022-06-18 DIAGNOSIS — F900 Attention-deficit hyperactivity disorder, predominantly inattentive type: Secondary | ICD-10-CM

## 2022-06-18 MED ORDER — AMPHETAMINE-DEXTROAMPHET ER 30 MG PO CP24: 30.0000 mg | ORAL_CAPSULE | ORAL | 0 refills | Status: DC

## 2022-06-19 ENCOUNTER — Other Ambulatory Visit: Payer: Self-pay | Admitting: Internal Medicine

## 2022-06-19 DIAGNOSIS — F900 Attention-deficit hyperactivity disorder, predominantly inattentive type: Secondary | ICD-10-CM

## 2022-06-19 MED ORDER — AMPHETAMINE-DEXTROAMPHET ER 30 MG PO CP24: 30.0000 mg | ORAL_CAPSULE | ORAL | 0 refills | Status: DC

## 2022-06-23 ENCOUNTER — Other Ambulatory Visit: Payer: Self-pay | Admitting: Internal Medicine

## 2022-06-23 DIAGNOSIS — F900 Attention-deficit hyperactivity disorder, predominantly inattentive type: Secondary | ICD-10-CM

## 2022-06-23 MED ORDER — AMPHETAMINE-DEXTROAMPHET ER 30 MG PO CP24: 30.0000 mg | ORAL_CAPSULE | ORAL | 0 refills | Status: DC

## 2022-08-26 ENCOUNTER — Other Ambulatory Visit: Payer: Self-pay | Admitting: Internal Medicine

## 2022-08-26 DIAGNOSIS — F900 Attention-deficit hyperactivity disorder, predominantly inattentive type: Secondary | ICD-10-CM

## 2022-08-26 MED ORDER — AMPHETAMINE-DEXTROAMPHET ER 30 MG PO CP24: 30.0000 mg | ORAL_CAPSULE | ORAL | 0 refills | Status: DC

## 2022-09-29 ENCOUNTER — Other Ambulatory Visit: Payer: Self-pay | Admitting: Internal Medicine

## 2022-09-29 DIAGNOSIS — F900 Attention-deficit hyperactivity disorder, predominantly inattentive type: Secondary | ICD-10-CM

## 2022-09-29 MED ORDER — AMPHETAMINE-DEXTROAMPHET ER 30 MG PO CP24: 30.0000 mg | ORAL_CAPSULE | ORAL | 0 refills | Status: DC

## 2022-10-30 ENCOUNTER — Other Ambulatory Visit: Payer: Self-pay | Admitting: Internal Medicine

## 2022-10-30 DIAGNOSIS — F900 Attention-deficit hyperactivity disorder, predominantly inattentive type: Secondary | ICD-10-CM

## 2022-11-04 ENCOUNTER — Ambulatory Visit: Payer: Medicaid Other | Admitting: Internal Medicine

## 2022-11-05 ENCOUNTER — Ambulatory Visit: Payer: Medicaid Other | Admitting: Family Medicine

## 2022-11-09 ENCOUNTER — Ambulatory Visit: Payer: Medicaid Other | Admitting: Internal Medicine

## 2022-11-09 ENCOUNTER — Telehealth: Payer: Self-pay | Admitting: Internal Medicine

## 2022-11-09 NOTE — Telephone Encounter (Signed)
Patient would like to reschedule his appointment for 1:00 today, but Dr. Yetta Barre does not have availability until 12/04/22. Patient would like to know if this appointment can be with any other provider since he is moving soon. Best callback is [phone].

## 2022-11-13 ENCOUNTER — Emergency Department (HOSPITAL_COMMUNITY)
Admission: EM | Admit: 2022-11-13 | Discharge: 2022-11-13 | Disposition: A | Discharge status: Home / Self Care | Payer: Medicaid Other | Attending: Emergency Medicine | Admitting: Emergency Medicine

## 2022-11-13 ENCOUNTER — Encounter (HOSPITAL_COMMUNITY): Payer: Self-pay | Admitting: Emergency Medicine

## 2022-11-13 DIAGNOSIS — R000 Tachycardia, unspecified: Secondary | ICD-10-CM | POA: Diagnosis not present

## 2022-11-13 DIAGNOSIS — F10129 Alcohol abuse with intoxication, unspecified: Secondary | ICD-10-CM | POA: Diagnosis present

## 2022-11-13 DIAGNOSIS — Z113 Encounter for screening for infections with a predominantly sexual mode of transmission: Secondary | ICD-10-CM | POA: Diagnosis not present

## 2022-11-13 DIAGNOSIS — F10920 Alcohol use, unspecified with intoxication, uncomplicated: Secondary | ICD-10-CM

## 2022-11-13 DIAGNOSIS — Y909 Presence of alcohol in blood, level not specified: Secondary | ICD-10-CM | POA: Diagnosis not present

## 2022-11-13 LAB — RPR: RPR Ser Ql: NONREACTIVE

## 2022-11-13 LAB — HIV ANTIBODY (ROUTINE TESTING W REFLEX): HIV Screen 4th Generation wRfx: NONREACTIVE

## 2022-11-13 NOTE — Discharge Instructions (Addendum)
Alcohol use-if you feel like you need help with your alcohol consumption of given you resources please review STD screening-results will be back next 24 to 48 hours, check your MyChart account for further information, may always follow-up with the [facility] department as a screen and treat for free.  Come back to the emergency department if you develop chest pain, shortness of breath, severe abdominal pain, uncontrolled nausea, vomiting, diarrhea.

## 2022-11-13 NOTE — ED Notes (Signed)
Patient ambulatory to bed with steady gait

## 2022-11-13 NOTE — ED Triage Notes (Addendum)
EMS called by [place] [employer] for being asleep on ground. He was found to be intoxicated with altered mental status but has since improved in his cognitive status upon arousal and ride to ED. He was [place] on grass on campus. He also endorses mushroom usage and since he is here, he "could use some STD testing too". CBG 110. VSS.

## 2022-11-13 NOTE — ED Provider Notes (Signed)
[facility] EM[facility]Y DE[facility]MENT AT [facility] Provider Note   CSN: [id number] Arrival date & time: 11/13/22  0348     History  Chief Complaint  Patient presents with   Alcohol Intoxication    Curtis Kirk is a 27 y.o. male.  HPI   Patient with medical history including ADHD, presenting with complaints of alcohol intoxication.  Patient states that he is not sure why he was brought here, states that he was at bars tonight and was drinking, he states that he does not feel like he got too intoxicated, he denies any falls, denies any headaches/head pains, no neck pains no back pain, no chest pain no abdominal pain no nausea vomiting not complaining of pain in the upper and lower extremities.  Patient has no real complaints.  Patient states that he would like to be tested for STDs, he denies any penile discharge or testicular pain, no suprapubic pain flank pain or abdominal pain.  Per EMS patient was found asleep on the ground, was too intoxicated to allow to go home, patient was brought here for further evaluation.   Home Medications Prior to Admission medications   Medication Sig Start Date End Date Taking? Authorizing Provider  acetaminophen (TYLENOL) 500 MG tablet Take 500 mg by mouth every 6 (six) hours as needed for moderate pain.    [provider]  amphetamine-dextroamphetamine (ADDERALL XR) 30 MG 24 hr capsule Take 1 capsule (30 mg total) by mouth every morning. 09/29/22   Etta Grandchild, MD      Allergies    Patient has no known allergies.    Review of Systems   Review of Systems  Constitutional:  Negative for chills and fever.  Respiratory:  Negative for shortness of breath.   Cardiovascular:  Negative for chest pain.  Gastrointestinal:  Negative for abdominal pain.  Neurological:  Negative for headaches.    Physical Exam Updated Vital Signs BP 127/76 (BP Location: Left Arm)   Pulse 95   Temp 97.8 F (36.6 C) (Oral)   Resp 18   Ht 6\' 1"   (1.854 m)   Wt 86.2 kg   SpO2 94%   BMI 25.07 kg/m  Physical Exam Vitals and nursing note reviewed.  Constitutional:      General: He is not in acute distress.    Appearance: He is not ill-appearing.  HENT:     Head: Normocephalic and atraumatic.     Comments: There is no deformity of the head present no raccoon eyes or Battle sign noted.    Nose: No congestion.     Mouth/Throat:     Mouth: Mucous membranes are moist.     Pharynx: Oropharynx is clear. No oropharyngeal exudate or posterior oropharyngeal erythema.     Comments: No trismus no torticollis no oral trauma present. Eyes:     Extraocular Movements: Extraocular movements intact.     Conjunctiva/sclera: Conjunctivae normal.     Pupils: Pupils are equal, round, and reactive to light.  Cardiovascular:     Rate and Rhythm: Regular rhythm. Tachycardia present.     Pulses: Normal pulses.     Heart sounds: No murmur heard.    No friction rub. No gallop.  Pulmonary:     Effort: No respiratory distress.     Breath sounds: No wheezing, rhonchi or rales.     Comments: No deformity of the chest chest was nontender lung sounds clear bilaterally. Abdominal:     Palpations: Abdomen is soft.  Tenderness: There is no abdominal tenderness. There is no right CVA tenderness or left CVA tenderness.     Comments: Abdomen is soft nontender  Musculoskeletal:     Comments: Spine was palpated was nontender to palpation no step-off deformities noted no pelvis instability, no leg Shortening.  Moving upper and lower extremities without difficulty.  Skin:    General: Skin is warm and dry.  Neurological:     Mental Status: He is alert.     Comments: No facial asymmetry no difficulty with word finding following two-step commands there is no unilateral weakness present.  Psychiatric:        Mood and Affect: Mood normal.     ED Results / Procedures / Treatments   Labs (all labs ordered are listed, but only abnormal results are  displayed) Labs Reviewed  RPR  HIV ANTIBODY (ROUTINE TESTING W REFLEX)  GC/CHLAMYDIA PROBE AMP ([place]) NOT AT [facility]    EKG None  Radiology No results found.  Procedures Procedures    Medications Ordered in ED Medications - No data to display  ED Course/ Medical Decision Making/ A&P                             Medical Decision Making Amount and/or Complexity of Data Reviewed Labs: ordered.   This patient presents to the ED for concern of alcohol intoxication, this involves an extensive number of treatment options, and is a complaint that carries with it a high risk of complications and morbidity.  The differential diagnosis includes withdrawals, intracranial bleed, psychiatric emergency    Additional history obtained:  Additional history obtained from EMS External records from outside source obtained and reviewed including internal medicine notes   Co morbidities that complicate the patient evaluation  N/A  Social Determinants of Health:  N/A    Lab Tests:  I Ordered, and personally interpreted labs.  The pertinent results include: HIV pending, RPR pending,   Imaging Studies ordered:  I ordered imaging studies including N/A I independently visualized and interpreted imaging which showed N/A I agree with the radiologist interpretation   Cardiac Monitoring:  The patient was maintained on a cardiac monitor.  I personally viewed and interpreted the cardiac monitored which showed an underlying rhythm of: N/A   Medicines ordered and prescription drug management:  I ordered medication including N/A I have reviewed the patients home medicines and have made adjustments as needed  Critical Interventions:  N/A   Reevaluation:  Presents due to alcohol intoxication, patient is slightly impaired on my exam, he has no significant findings, requesting STD evaluation, will obtain lab workup and urine.  Allow patient to metabolize.  Patient is  tolerating p.o., ambulate without difficulty, he is agreement discharge at this time.  Consultations Obtained:  N/a    Test Considered:  CT imaging of the head-deferred my suspicion for intracranial bleed low at this time no evidence of trauma on exam he denies hitting his head or losing conscious.  He has no focal deficits.    Rule out Suspicion for withdrawals is low at this time vital signs reassuring nontrauma's on my exam.  I doubt psychiatric emergency not endorsing suicidal homicidal ideations does not appear to respond to internal stimuli.  Will defer on prophylactic amount of STI as he is not endorsing any symptoms.    Dispostion and problem list  After consideration of the diagnostic results and the patients response to treatment, I feel  that the patent would benefit from discharge.  Alcohol intoxication-will provide outpatient resources patient feels he needs help with alcohol use. STD screening-patient was made aware results will be available next 24 to 48 hours, may follow-up with his PCP for further assessment.          Final Clinical Impression(s) / ED Diagnoses Final diagnoses:  Alcoholic intoxication without complication (HCC)  Screening examination for STD (sexually transmitted disease)    Rx / DC Orders ED Discharge Orders     None         Carroll Sage, PA-C 11/13/22 1610    Glynn Octave, MD 11/13/22 [phone]

## 2022-11-13 NOTE — ED Notes (Signed)
Pt is currently alert and oriented- speech is slurred- gave him water and instructed him to drink.

## 2022-11-16 LAB — GC/CHLAMYDIA PROBE AMP (~~LOC~~) NOT AT ARMC
Chlamydia: NEGATIVE
Comment: NEGATIVE
Comment: NORMAL
Neisseria Gonorrhea: NEGATIVE

## 2022-11-27 ENCOUNTER — Other Ambulatory Visit: Payer: Self-pay | Admitting: Internal Medicine

## 2022-11-27 DIAGNOSIS — F900 Attention-deficit hyperactivity disorder, predominantly inattentive type: Secondary | ICD-10-CM

## 2022-12-12 ENCOUNTER — Other Ambulatory Visit: Payer: Self-pay | Admitting: Internal Medicine

## 2022-12-12 DIAGNOSIS — F900 Attention-deficit hyperactivity disorder, predominantly inattentive type: Secondary | ICD-10-CM

## 2022-12-14 ENCOUNTER — Encounter (HOSPITAL_COMMUNITY): Payer: Self-pay

## 2022-12-14 ENCOUNTER — Telehealth: Payer: Medicaid Other | Admitting: Physician Assistant

## 2022-12-14 ENCOUNTER — Encounter: Payer: Self-pay | Admitting: Internal Medicine

## 2022-12-14 ENCOUNTER — Ambulatory Visit (HOSPITAL_COMMUNITY)
Admission: EM | Admit: 2022-12-14 | Discharge: 2022-12-14 | Disposition: A | Discharge status: Home / Self Care | Payer: Medicaid Other | Attending: Internal Medicine | Admitting: Internal Medicine

## 2022-12-14 DIAGNOSIS — Z760 Encounter for issue of repeat prescription: Secondary | ICD-10-CM | POA: Diagnosis not present

## 2022-12-14 DIAGNOSIS — F902 Attention-deficit hyperactivity disorder, combined type: Secondary | ICD-10-CM

## 2022-12-14 DIAGNOSIS — F900 Attention-deficit hyperactivity disorder, predominantly inattentive type: Secondary | ICD-10-CM

## 2022-12-14 MED ORDER — AMPHETAMINE-DEXTROAMPHET ER 30 MG PO CP24: 30.0000 mg | ORAL_CAPSULE | ORAL | 0 refills | Status: AC

## 2022-12-14 NOTE — Discharge Instructions (Addendum)
Please establish care with a primary care provider to help with medication refills.  I would encourage you to do this sooner than later.

## 2022-12-14 NOTE — ED Provider Notes (Signed)
[facility]    CSN: [id number] Arrival date & time: 12/14/22  1227      History   Chief Complaint Chief Complaint  Patient presents with   Medication Refill    HPI Curtis Kirk is a 27 y.o. male with a history of ADHD currently managed on Adderall comes to urgent care requesting medication refill.  Patient is moving from [place] to [place] on August 1.  He could not get an appointment with his primary provider until August 1.  He is concerned that he may miss that appointment because he will be in the process of relocating to [place].  Patient ran out of his medications about a month ago and apparently has been unable to get an appointment for medication refills.  He endorses significant social stressors at this time.   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 No pertinent surgical history.     Home Medications    Prior to Admission medications   Medication Sig Start Date End Date Taking? Authorizing Provider  acetaminophen (TYLENOL) 500 MG tablet Take 500 mg by mouth every 6 (six) hours as needed for moderate pain.    [provider]  amphetamine-dextroamphetamine (ADDERALL XR) 30 MG 24 hr capsule Take 1 capsule (30 mg total) by mouth every morning. 12/14/22   Marely Apgar, Britta Mccreedy, MD    Family History Family History  Problem Relation Age of Onset   Lupus Mother     Social History Social History   Tobacco Use   Smoking status: Never   Smokeless tobacco: Never  Substance Use Topics   Alcohol use: Yes    Alcohol/week: 3.0 standard drinks of alcohol    Types: 3 Shots of liquor per week    Comment: states 'i'm never gonna drink again' and that he rarely drinks   Drug use: No     Allergies   Patient has no known  allergies.   Review of Systems Review of Systems As per HPI  Physical Exam Triage Vital Signs ED Triage Vitals  Encounter Vitals Group     BP 12/14/22 1245 126/86     Systolic BP Percentile --      Diastolic BP Percentile --      Pulse Rate 12/14/22 1245 70     Resp 12/14/22 1245 16     Temp 12/14/22 1245 97.9 F (36.6 C)     Temp Source 12/14/22 1245 Oral     SpO2 12/14/22 1245 97 %     Weight 12/14/22 1245 185 lb (83.9 kg)     Height 12/14/22 1245 6\' 1"  (1.854 m)     Head Circumference --      Peak Flow --      Pain Score 12/14/22 1243 0     Pain Loc --      Pain Education --      Exclude from Growth Chart --    No data found.  Updated Vital Signs BP 126/86 (BP Location: Left Arm)   Pulse 70   Temp 97.9 F (36.6 C) (Oral)   Resp 16   Ht 6\' 1"  (1.854 m)   Wt 83.9 kg   SpO2 97%   BMI 24.41 kg/m   Visual Acuity Right Eye Distance:   Left Eye Distance:  Bilateral Distance:    Right Eye Near:   Left Eye Near:    Bilateral Near:     Physical Exam Vitals and nursing note reviewed.  Constitutional:      General: He is not in acute distress.    Appearance: Normal appearance. He is not ill-appearing.  Cardiovascular:     Rate and Rhythm: Normal rate and regular rhythm.     Pulses: Normal pulses.     Heart sounds: Normal heart sounds.  Pulmonary:     Effort: Pulmonary effort is normal.     Breath sounds: Normal breath sounds.  Abdominal:     General: Bowel sounds are normal.     Palpations: Abdomen is soft.  Neurological:     Mental Status: He is alert.      UC Treatments / Results  Labs (all labs ordered are listed, but only abnormal results are displayed) Labs Reviewed - No data to display  EKG   Radiology No results found.  Procedures Procedures (including critical care time)  Medications Ordered in UC Medications - No data to display  Initial Impression / Assessment and Plan / UC Course  I have reviewed the triage vital signs and  the nursing notes.  Pertinent labs & imaging results that were available during my care of the patient were reviewed by me and considered in my medical decision making (see chart for details).     1.  ADHD: Medication refill has been sent to the pharmacy Patient is advised to continue taking Adderall as directed. Patient is advised to follow-up with primary care provider Return precautions given. Final Clinical Impressions(s) / UC Diagnoses   Final diagnoses:  Encounter for medication refill   Discharge Instructions   None    ED Prescriptions     Medication Sig Dispense Auth. Provider   amphetamine-dextroamphetamine (ADDERALL XR) 30 MG 24 hr capsule Take 1 capsule (30 mg total) by mouth every morning. 30 capsule Demarlo Riojas, Britta Mccreedy, MD      I have reviewed the PDMP during this encounter.   Merrilee Jansky, MD 12/14/22 1346

## 2022-12-14 NOTE — Progress Notes (Signed)
Patient needing urgent refill Adderall XR due to moving to [place]. PCP unable to see. Advised UC or call PCP to see if they may refill short supply

## 2022-12-14 NOTE — ED Triage Notes (Signed)
Patient here today for a refill on his Adderal. Patient has been under a lot fo stress lately and in the process of moving to [place]. He states that he had a seizure about 3 weeks ago.

## 2022-12-14 NOTE — Patient Instructions (Signed)
  Rayna Sexton Danford, thank you for joining Margaretann Loveless, PA-C for today's virtual visit.  While this provider is not your primary care provider (PCP), if your PCP is located in our provider database this encounter information will be shared with them immediately following your visit.   A [facility] MyChart account gives you access to today's visit and all your visits, tests, and labs performed at [facility] " click here if you don't have a [facility] MyChart account or go to [email or web address]  Consent: (Patient) Curtis Kirk provided verbal consent for this virtual visit at the beginning of the encounter.  Current Medications:  Current Outpatient Medications:    acetaminophen (TYLENOL) 500 MG tablet, Take 500 mg by mouth every 6 (six) hours as needed for moderate pain., Disp: , Rfl:    amphetamine-dextroamphetamine (ADDERALL XR) 30 MG 24 hr capsule, Take 1 capsule (30 mg total) by mouth every morning., Disp: 30 capsule, Rfl: 0   Medications ordered in this encounter:  No orders of the defined types were placed in this encounter.    *If you need refills on other medications prior to your next appointment, please contact your pharmacy*  Follow-Up: Call back or seek an in-person evaluation if the symptoms worsen or if the condition fails to improve as anticipated.  [facility] [phone]    If you have been instructed to have an in-person evaluation today at a local Urgent Care facility, please use the link below. It will take you to a list of all of our available [facility], including address, phone number and hours of operation. Please do not delay care.  [facility]  If you or a family member do not have a primary care provider, use the link below to schedule a visit and establish care. When you choose a [facility] primary care physician or advanced practice provider, you gain a long-term partner in health. Find a  Primary Care Provider  Learn more about [facility] in-office and virtual care options: [facility] - Get Care Now

## 2022-12-16 ENCOUNTER — Ambulatory Visit: Payer: Medicaid Other | Admitting: Internal Medicine

## 2022-12-29 ENCOUNTER — Encounter: Payer: Self-pay | Admitting: Internal Medicine

## 2023-03-15 ENCOUNTER — Ambulatory Visit: Payer: Medicaid Other | Admitting: Internal Medicine
